# Patient Record
Sex: Male | Born: 1995 | Race: White | Hispanic: No | Marital: Single | Smoking: Never smoker
Health system: Southern US, Community
[De-identification: ages and names within clinical notes are randomized; demographics above are authoritative.]

---
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
## 2016-06-17 ENCOUNTER — Encounter (HOSPITAL_COMMUNITY): Payer: Self-pay | Admitting: *Deleted

## 2016-06-17 ENCOUNTER — Emergency Department (HOSPITAL_COMMUNITY)
Admission: EM | Admit: 2016-06-17 | Discharge: 2016-06-18 | Disposition: A | Discharge status: Home / Self Care | Payer: 59 | Attending: Emergency Medicine | Admitting: Emergency Medicine

## 2016-06-17 DIAGNOSIS — R112 Nausea with vomiting, unspecified: Secondary | ICD-10-CM | POA: Diagnosis present

## 2016-06-17 DIAGNOSIS — R509 Fever, unspecified: Secondary | ICD-10-CM | POA: Diagnosis not present

## 2016-06-17 DIAGNOSIS — K529 Noninfective gastroenteritis and colitis, unspecified: Secondary | ICD-10-CM | POA: Diagnosis not present

## 2016-06-17 LAB — COMPREHENSIVE METABOLIC PANEL
ALBUMIN: 3.9 g/dL (ref 3.5–5.0)
ALK PHOS: 67 U/L (ref 38–126)
ALT: 21 U/L (ref 17–63)
AST: 28 U/L (ref 15–41)
Anion gap: 13 (ref 5–15)
BILIRUBIN TOTAL: 1.7 mg/dL — AB (ref 0.3–1.2)
BUN: 16 mg/dL (ref 6–20)
CALCIUM: 9.4 mg/dL (ref 8.9–10.3)
CO2: 24 mmol/L (ref 22–32)
CREATININE: 1.18 mg/dL (ref 0.61–1.24)
Chloride: 98 mmol/L — ABNORMAL LOW (ref 101–111)
GFR calc Af Amer: >60 mL/min (ref >60)
GFR calc non Af Amer: >60 mL/min (ref >60)
GLUCOSE: 118 mg/dL — AB (ref 65–99)
Potassium: 3.7 mmol/L (ref 3.5–5.1)
SODIUM: 135 mmol/L (ref 135–145)
TOTAL PROTEIN: 7.9 g/dL (ref 6.5–8.1)

## 2016-06-17 LAB — URINALYSIS, ROUTINE W REFLEX MICROSCOPIC
Glucose, UA: NEGATIVE mg/dL
HGB URINE DIPSTICK: NEGATIVE
KETONES UR: 15 mg/dL — AB
NITRITE: NEGATIVE
Protein, ur: 30 mg/dL — AB
SPECIFIC GRAVITY, URINE: 1.021 (ref 1.005–1.030)
pH: 6 (ref 5.0–8.0)

## 2016-06-17 LAB — CBC
HCT: 39.4 % (ref 39.0–52.0)
HEMOGLOBIN: 14.3 g/dL (ref 13.0–17.0)
MCH: 29.7 pg (ref 26.0–34.0)
MCHC: 36.3 g/dL — AB (ref 30.0–36.0)
MCV: 81.7 fL (ref 78.0–100.0)
Platelets: 292 10*3/uL (ref 150–400)
RBC: 4.82 MIL/uL (ref 4.22–5.81)
RDW: 12.9 % (ref 11.5–15.5)
WBC: 13.4 10*3/uL — ABNORMAL HIGH (ref 4.0–10.5)

## 2016-06-17 LAB — LIPASE, BLOOD: Lipase: 32 U/L (ref 11–51)

## 2016-06-17 LAB — URINE MICROSCOPIC-ADD ON

## 2016-06-17 MED ORDER — ONDANSETRON 4 MG PO TBDP
8.0000 mg | ORAL_TABLET | Freq: Once | ORAL | Status: AC
  Administered 2016-06-17: 8 mg via ORAL
  Filled 2016-06-17: qty 2

## 2016-06-17 MED ORDER — ACETAMINOPHEN 325 MG PO TABS
650.0000 mg | ORAL_TABLET | Freq: Once | ORAL | Status: AC
  Administered 2016-06-17: 650 mg via ORAL
  Filled 2016-06-17: qty 2

## 2016-06-17 NOTE — ED Triage Notes (Signed)
zofran has stopped his nausea and vomiting  Needs tyelnol

## 2016-06-17 NOTE — ED Notes (Signed)
Pt stopped vomiting from the zofran.  Temp up  Tylenol given

## 2016-06-17 NOTE — ED Triage Notes (Signed)
The pt has had n v with diarrhea since Saturday  He was seen at urgent care on Sunday  And had a work up  But he is stil having nv and diarrhea

## 2016-06-18 ENCOUNTER — Emergency Department (HOSPITAL_COMMUNITY): Payer: 59

## 2016-06-18 LAB — PROTEIN / CREATININE RATIO, URINE
Creatinine, Urine: 423.75 mg/dL
Protein Creatinine Ratio: 0.14 mg/mg{Cre} (ref 0.00–0.15)
Total Protein, Urine: 60 mg/dL

## 2016-06-18 LAB — MONONUCLEOSIS SCREEN: MONO SCREEN: NEGATIVE

## 2016-06-18 MED ORDER — ONDANSETRON HCL 4 MG/2ML IJ SOLN
4.0000 mg | Freq: Once | INTRAMUSCULAR | Status: AC
  Administered 2016-06-18: 4 mg via INTRAVENOUS
  Filled 2016-06-18: qty 2

## 2016-06-18 MED ORDER — SODIUM CHLORIDE 0.9 % IV BOLUS (SEPSIS)
2000.0000 mL | Freq: Once | INTRAVENOUS | Status: AC
  Administered 2016-06-18: 2000 mL via INTRAVENOUS

## 2016-06-18 NOTE — ED Notes (Signed)
Pt informed of discharge instructions verbalized understanding and stable

## 2016-06-18 NOTE — ED Provider Notes (Signed)
[facility]-EMERGENCY DEPT Provider Note   CSN: [id number] Arrival date & time: 06/17/16  2037    History   Chief Complaint Chief Complaint  Patient presents with  . Emesis    HPI Nathan Graves is a 20 y.o. male.  20 year old male presents to the emergency department for evaluation of persistent nausea, vomiting, and diarrhea. Patient states that his symptoms began as a sore throat 4 days ago. This was followed by onset of nausea and vomiting. Patient developed a fever 3 days ago with a temperature of 104F. He went to be seen at urgent care and had a negative strep test; however, he was still discharged on a course of Azithromycin. Patient has been taking this medication, but mother is unsure how much she has been able to tolerate due to vomiting. He was given a prescription for Zofran from the urgent care, but accidentally did not take this for symptoms. Patient with persistent fever up to 102.21F in triage today. His nausea was well consoled with Zofran in triage. He reports a mild cough, but denies shortness of breath. He further denies hematemesis, melena, hematochezia, dysuria, hematuria, and headache or neck stiffness. No reported sick contacts. Patient with unknown history of mononucleosis. Abdominal surgical history significant for appendectomy.   The history is provided by the patient and a parent. No language interpreter was used.  Emesis      History reviewed. No pertinent past medical history.  There are no active problems to display for this patient.   History reviewed. No pertinent surgical history.    Home Medications    Prior to Admission medications   Medication Sig Start Date End Date Taking? Authorizing Provider  azithromycin (ZITHROMAX) 250 MG tablet Take 250 mg by mouth daily. 06/15/16  Yes Historical Provider, MD  hydrOXYzine (ATARAX/VISTARIL) 25 MG tablet Take 25 mg by mouth every 6 (six) hours as needed for anxiety.  06/15/16  Yes Historical Provider, MD    ondansetron (ZOFRAN-ODT) 8 MG disintegrating tablet Take 8 mg by mouth every 8 (eight) hours as needed for nausea.  06/15/16  Yes Historical Provider, MD    Family History No family history on file.  Social History Social History  Substance Use Topics  . Smoking status: Never Smoker  . Smokeless tobacco: Never Used  . Alcohol use Yes     Allergies   Patient has no known allergies.   Review of Systems Review of Systems  Gastrointestinal: Positive for vomiting.  Ten systems reviewed and are negative for acute change, except as noted in the HPI.    Physical Exam Updated Vital Signs BP 123/57   Pulse 65   Temp 99 F (37.2 C) (Oral)   Resp 18   Ht 6\' 2"  (1.88 m)   SpO2 97%   Physical Exam  Constitutional: He is oriented to person, place, and time. He appears well-developed and well-nourished. No distress.  Nontoxic and in NAD  HENT:  Head: Normocephalic and atraumatic.  Mouth/Throat: Oropharynx is clear and moist.  Uvula midline. No posterior oropharyngeal exudates. No tripoding or stridor.  Eyes: Conjunctivae and EOM are normal. No scleral icterus.  Neck: Normal range of motion.  No nuchal rigidity or meningismus  Cardiovascular: Normal rate, regular rhythm and intact distal pulses.   Pulmonary/Chest: Effort normal. No respiratory distress. He has no wheezes. He has no rales.  Abdominal: Soft. He exhibits no distension and no mass. There is no tenderness. There is no guarding.  Soft nontender abdomen. No masses. No  peritoneal signs.  Musculoskeletal: Normal range of motion.  Neurological: He is alert and oriented to person, place, and time. He exhibits normal muscle tone. Coordination normal.  GCS 15. Patient moving all extremities.  Skin: Skin is warm and dry. No rash noted. He is not diaphoretic. No erythema. No pallor.  Psychiatric: He has a normal mood and affect. His behavior is normal.  Nursing note and vitals reviewed.    ED Treatments / Results   Labs (all labs ordered are listed, but only abnormal results are displayed) Labs Reviewed  COMPREHENSIVE METABOLIC PANEL - Abnormal; Notable for the following:       Result Value   Chloride 98 (*)    Glucose, Bld 118 (*)    Total Bilirubin 1.7 (*)    All other components within normal limits  CBC - Abnormal; Notable for the following:    WBC 13.4 (*)    MCHC 36.3 (*)    All other components within normal limits  URINALYSIS, ROUTINE W REFLEX MICROSCOPIC (NOT [facility]) - Abnormal; Notable for the following:    Color, Urine AMBER (*)    APPearance CLOUDY (*)    Bilirubin Urine SMALL (*)    Ketones, ur 15 (*)    Protein, ur 30 (*)    Leukocytes, UA TRACE (*)    All other components within normal limits  URINE MICROSCOPIC-ADD ON - Abnormal; Notable for the following:    Squamous Epithelial / LPF 0-5 (*)    Bacteria, UA FEW (*)    Casts HYALINE CASTS (*)    All other components within normal limits  CULTURE, BLOOD (ROUTINE X 2)  CULTURE, BLOOD (ROUTINE X 2)  LIPASE, BLOOD  MONONUCLEOSIS SCREEN    EKG  EKG Interpretation None       Radiology Dg Chest 2 View  Result Date: 06/18/2016 CLINICAL DATA:  Acute onset of cough, fever and vomiting. Initial encounter. EXAM: CHEST  2 VIEW COMPARISON:  None. FINDINGS: The lungs are well-aerated. Peribronchial thickening is noted. There is no evidence of focal opacification, pleural effusion or pneumothorax. The heart is normal in size; the mediastinal contour is within normal limits. No acute osseous abnormalities are seen. IMPRESSION: Peribronchial thickening noted.  Lungs otherwise grossly clear. Electronically Signed   By: Roanna RaiderJeffery  Chang M.D.   On: 06/18/2016 01:22    Procedures Procedures (including critical care time)  Medications Ordered in ED Medications  ondansetron (ZOFRAN-ODT) disintegrating tablet 8 mg (8 mg Oral Given 06/17/16 2056)  acetaminophen (TYLENOL) tablet 650 mg (650 mg Oral Given 06/17/16 2325)  sodium chloride  0.9 % bolus 2,000 mL (0 mLs Intravenous Stopped 06/18/16 0317)  ondansetron (ZOFRAN) injection 4 mg (4 mg Intravenous Given 06/18/16 0105)     Initial Impression / Assessment and Plan / ED Course  I have reviewed the triage vital signs and the nursing notes.  Pertinent labs & imaging results that were available during my care of the patient were reviewed by me and considered in my medical decision making (see chart for details).  Clinical Course     20 year old male presents to the emergency department for evaluation of nausea, vomiting, and diarrhea in the presence of fever. Patient with history of a negative strep screen 2 days ago performed at Urgent Care. His symptoms have persisted despite treatment with azithromycin. Patient with a negative Monospot today. He has a mild leukocytosis and fever, though he is without tachycardia, tachypnea, or hypotension. Precautionary blood cultures were sent. Fever responded well to  Tylenol.  Patient hydrated in the emergency department with 2 L of IV fluids as ketonuria suggestive of dehydration. While creatinine is within normal limits, it is elevated from the patient's baseline which is also likely due to dehydration. No edema, hypertension, or gross hematuria to suggest PSGN.  Patient states that he is feeling better after the zofran. He has tolerated oral fluids without further emesis. Suspect viral etiology. I believe the patient is stable for discharge with instruction for outpatient follow up with his pediatrician in 1 day, especially if fever persists. Return precautions discussed and provided. Patient discharged in stable condition. Mother and patient with no unaddressed concerns.   Final Clinical Impressions(s) / ED Diagnoses   Final diagnoses:  Febrile illness  Gastroenteritis    New Prescriptions Discharge Medication List as of 06/18/2016  3:02 AM       Antony MaduraKelly Ciro Tashiro, PA-C 06/18/16 [id number]    Glynn OctaveStephen Rancour, MD 06/18/16 [phone]

## 2016-06-18 NOTE — Discharge Instructions (Signed)
Take Zofran for nausea/vomiting as previously prescribed to you. Be sure to drink plenty of clear liquids to prevent dehydration. Avoid fried foods, fatty foods, greasy foods, and milk products until symptoms resolve. Take Tylenol and/or ibuprofen for fever. We advise you to follow-up with your primary care doctor tomorrow if fever persists. You may return to the emergency department for any new or concerning symptoms.

## 2016-06-23 LAB — CULTURE, BLOOD (ROUTINE X 2)
CULTURE: NO GROWTH
Culture: NO GROWTH

## 2016-07-04 ENCOUNTER — Other Ambulatory Visit: Payer: Self-pay | Admitting: Surgery
